# Patient Record
Sex: Male | Born: 1990 | Race: White | Hispanic: No | Marital: Single | Smoking: Never smoker
Health system: Southern US, Community
[De-identification: ages and names within clinical notes are randomized; demographics above are authoritative.]

## PROBLEM LIST (undated history)

## (undated) DIAGNOSIS — L309 Dermatitis, unspecified: Secondary | ICD-10-CM

## (undated) HISTORY — DX: Dermatitis, unspecified: L30.9

---
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
## 2019-02-26 ENCOUNTER — Ambulatory Visit: Payer: BC Managed Care – PPO | Admitting: Allergy & Immunology

## 2019-02-26 ENCOUNTER — Encounter: Payer: Self-pay | Admitting: Allergy & Immunology

## 2019-02-26 ENCOUNTER — Other Ambulatory Visit: Payer: Self-pay

## 2019-02-26 VITALS — BP 116/68 | HR 86 | Temp 98.3°F | Resp 16 | Ht 75.79 in | Wt 199.4 lb

## 2019-02-26 DIAGNOSIS — J3089 Other allergic rhinitis: Secondary | ICD-10-CM

## 2019-02-26 DIAGNOSIS — J302 Other seasonal allergic rhinitis: Secondary | ICD-10-CM

## 2019-02-26 DIAGNOSIS — L232 Allergic contact dermatitis due to cosmetics: Secondary | ICD-10-CM | POA: Diagnosis not present

## 2019-02-26 MED ORDER — MONTELUKAST SODIUM 10 MG PO TABS: 10.0000 mg | ORAL_TABLET | Freq: Every day | ORAL | 2 refills | Status: DC

## 2019-02-26 MED ORDER — FLUTICASONE PROPIONATE 93 MCG/ACT NA EXHU: 2.0000 | INHALANT_SUSPENSION | Freq: Two times a day (BID) | NASAL | 5 refills | Status: DC

## 2019-02-26 MED ORDER — EPINEPHRINE 0.3 MG/0.3ML IJ SOAJ: 0.3000 mg | Freq: Once | INTRAMUSCULAR | 2 refills | Status: AC

## 2019-02-26 NOTE — Progress Notes (Signed)
NEW PATIENT  Date of Service/Encounter:  02/26/19  Referring provider: Patient, No Pcp Per   Assessment:   Seasonal and perennial allergic rhinitis (grasses, ragweed, weeds, trees, dust mites, dog and cockroach)  Allergic contact dermatitis due to cosmetics  Plan/Recommendations:   1. Seasonal and perennial allergic rhinitis - Testing today showed: grasses, ragweed, weeds, trees, dust mites, dog and cockroach - Copy of test results provided.  - Avoidance measures provided. - Stop taking: Zyrtec (cetirizine) - Start taking: Xyzal (levocetirizine) 5mg  tablet once daily, Singulair (montelukast) 10mg  daily and Xhance (fluticasone) 1-2 sprays per nostril twice daily  - We will send in the script to the [facility], and they will call you to confirm your shipping address. - You can review how to use the device here: https://www.xhance.com - Ask to be enrolled in the auto-refill program so you can get a year for free. - You can use an extra dose of the antihistamine, if needed, for breakthrough symptoms.  - Consider nasal saline rinses 1-2 times daily to remove allergens from the nasal cavities as well as help with mucous clearance (this is especially helpful to do before the nasal sprays are given) - Consider allergy shots as a means of long-term control. - Allergy shots "re-train" and "reset" the immune system to ignore environmental allergens and decrease the resulting immune response to those allergens (sneezing, itchy watery eyes, runny nose, nasal congestion, etc).    - Allergy shots improve symptoms in 75-85% of patients.  - We can discuss more at the next appointment if the medications are not working for you.  2. 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 - You need to have a few episodes of exposure before you develop a contact dermatitis, therefore the two  days preceding the emergence of the rash might explain the asymptomatic use before the allergy developed. - I would recommend avoiding that product for now. - If it happens again, we could do patch testing but at this point I think avoiding the triggering lotion would be enough.   3. Return in about 3 months (around 05/29/2019). This can be an in-person, a virtual Webex or a telephone follow up visit.   Subjective:   Brandon Stevens is a 28 y.o. male presenting today for evaluation of  Chief Complaint  Patient presents with  . Allergic Rhinitis     Brandon Stevens has a history of the following: There are no active problems to display for this patient.   History obtained from: chart review and patient.  He is a son-in-law of 1 of our other patients - Brandon Stevens.   Brandon Stevens was referred by Patient, No Pcp Per.     Brandon Stevens is a 28 y.o. male presenting for an evaluation of allergic rhinitis.   Allergic Rhinitis Symptom History: He has had allergies since he was a kid.  He was first allergy tested in 2004 when he was 28 years old.  At that time, he was developing hives when he was playing football in the grass. He had testing that demonstrated multiple positives and he was on shots for five years.  He does think that it helped with the frequency of the hives.  Regardless, he has been fairly stable for a few years.  Over the last 2 to 3 years, his symptoms have been more seasonal and have been  controlled with Zyrtec.  However, over the last year, his symptoms have become more persistent and more severe.  He reports that he has rhinorrhea and itchy watery eyes.  He does not use a nose spray, as he felt that it just came out the front of his nose rather than helping at all.  He thinks he was on Flonase over 4 years ago.    Rash Symptom History: He denies a history of eczema, but he does report that he had a sunburn over the weekend.  He used some type of ointment of his wife's.  He did fine  for a couple days but then on the third day developed confluent erythema and an itchy painful sensation.  This is now resolved after a cold shower.  He does have a history of recurrent sinusitis.  He estimates that he gets treated for sinus infections in the spring and the fall.  He otherwise has no infectious history.  Otherwise, there is no history of other atopic diseases, including asthma, food allergies, drug allergies, stinging insect allergies, urticaria or contact dermatitis. There is no significant infectious history. Vaccinations are up to date.    Past Medical History: There are no active problems to display for this patient.   Medication List:  Allergies as of 02/26/2019   No Known Allergies     Medication List       Accurate as of February 26, 2019  4:47 PM. If you have any questions, ask your nurse or doctor.        cetirizine 10 MG tablet Commonly known as:  ZYRTEC Take by mouth.   EPINEPHrine 0.3 mg/0.3 mL Soaj injection Commonly known as:  Auvi-Q Inject 0.3 mLs (0.3 mg total) into the muscle once for 1 dose. As directed for life-threatening allergic reactions Started by:  Valentina Shaggy, MD   Fluticasone Propionate 93 MCG/ACT Exhu Commonly known as:  Xhance Place 2 sprays into the nose 2 (two) times a day. Started by:  Valentina Shaggy, MD   montelukast 10 MG tablet Commonly known as:  Singulair Take 1 tablet (10 mg total) by mouth at bedtime. Started by:  Valentina Shaggy, MD       Birth History: non-contributory  Developmental History: non-contributory  Past Surgical History: History reviewed. No pertinent surgical history.   Family History: Family History  Problem Relation Age of Onset  . Allergic rhinitis Mother      Social History: Brandon Stevens lives at home with his wife and 2 dogs.  He has grown up with dogs and has never had any symptoms from them.  He was allergic to cats when he was first tested.  Currently he lives in a house  with hardwoods and area rugs throughout the home.  They have electric heating and central cooling.  There are no dust mite covers on the bedding.  There is no tobacco exposure.  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  He did go [place] at [employer].  He went there on a baseball scholarship.   Review of Systems  Constitutional: Negative.  Negative for fever, malaise/fatigue and weight loss.  HENT: Positive for congestion. Negative for ear discharge and ear pain.        Positive for postnasal drip.  Eyes: Negative for pain, discharge and redness.  Respiratory: Negative for cough, sputum production, shortness of breath and wheezing.   Cardiovascular: Negative.  Negative for chest pain and palpitations.  Gastrointestinal: Negative for abdominal pain, heartburn, nausea and vomiting.  Skin: Positive for rash. Negative for itching.  Neurological: Negative for dizziness and headaches.  Endo/Heme/Allergies: Positive for environmental allergies. Does not bruise/bleed easily.       Objective:   Blood pressure 116/68, pulse 86, temperature 98.3 F (36.8 C), resp. rate 16, height 6' 3.79" (1.925 m), weight 199 lb 6.4 oz (90.4 kg), SpO2 95 %. Body mass index is 24.41 kg/m.   Physical Exam:   Physical Exam  Constitutional: He appears well-developed.  Tall male.  HENT:  Head: Normocephalic and atraumatic.  Right Ear: Tympanic membrane, external ear and ear canal normal. No drainage, swelling or tenderness. Tympanic membrane is not injected, not scarred, not erythematous, not retracted and not bulging.  Left Ear: Tympanic membrane, external ear and ear canal normal. No drainage, swelling or tenderness. Tympanic membrane is not injected, not scarred, not erythematous, not retracted and not bulging.  Nose: Mucosal edema and rhinorrhea present. No nasal deformity or septal deviation. No epistaxis. Right sinus exhibits no maxillary sinus tenderness and no  frontal sinus tenderness. Left sinus exhibits no maxillary sinus tenderness and no frontal sinus tenderness.  Mouth/Throat: Uvula is midline and oropharynx is clear and moist. Mucous membranes are not pale and not dry.  Clear rhinorrhea bilaterally.  His turbinates are markedly enlarged.  No polyps appreciated, although I cannot see very far back in his nostrils.  He does have cobblestoning present in the posterior oropharynx.  Eyes: Pupils are equal, round, and reactive to light. Conjunctivae and EOM are normal. Right eye exhibits no chemosis and no discharge. Left eye exhibits no chemosis and no discharge. Right conjunctiva is not injected. Left conjunctiva is not injected.  Cardiovascular: Normal rate, regular rhythm and normal heart sounds.  Respiratory: Effort normal and breath sounds normal. No accessory muscle usage. No tachypnea. No respiratory distress. He has no wheezes. He has no rhonchi. He has no rales. He exhibits no tenderness.  Moving air well in all lung fields.  GI: There is no abdominal tenderness. There is no rebound and no guarding.  Lymphadenopathy:       Head (right side): No submandibular, no tonsillar and no occipital adenopathy present.       Head (left side): No submandibular, no tonsillar and no occipital adenopathy present.    He has no cervical adenopathy.  Neurological: He is alert.  Skin: No abrasion, no petechiae and no rash noted. Rash is not papular, not vesicular and not urticarial. No erythema. No pallor.  Confluent erythema on the chest consistent with a sunburn.  Psychiatric: He has a normal mood and affect.     Diagnostic studies:   Allergy Studies:    Airborne Adult Perc - 02/26/19 1454    Time Antigen Placed  1454    Allergen Manufacturer  Lavella Hammock    Location  Back    Number of Test  59    Panel 1  Select    1. Control-Buffer 50% Glycerol  Negative    2. Control-Histamine 1 mg/ml  2+    3. Albumin saline  Negative    4. [place]  4+    5. [place]   2+    6. [place]  3+    7. [place]  4+    8. Meadow Fescue  3+    9. Perennial Rye  3+    10. Sweet Vernal  2+    11. Timothy  4+    12. Cocklebur  Negative    13. Burweed [place]er  2+    14. Ragweed, short  2+    15. Ragweed, Giant  2+    16. Plantain,  English  2+    17. Lamb's Quarters  Negative    18. Sheep Sorrell  2+    19. Rough Pigweed  2+    20. Marsh Elder, Rough  Negative    21. Mugwort, Common  2+    22. Ash mix  2+    23. Birch mix  3+    24. Beech American  3+    25. Box, Elder  3+    26. Cedar, red  2+    27. Cottonwood, Eastern  3+    28. Elm mix  2+    29. Hickory mix  2+    30. Maple mix  2+    31. Oak, [place] mix  4+    32. Pecan Pollen  2+    33. Pine mix  3+    34. Sycamore Eastern  2+    35. [place], Black Pollen  2+    36. Alternaria alternata  Negative    37. Cladosporium Herbarum  Negative    38. Aspergillus mix  Negative    39. Penicillium mix  Negative    40. Bipolaris sorokiniana (Helminthosporium)  Negative    41. Drechslera spicifera (Curvularia)  Negative    42. Mucor plumbeus  Negative    43. Fusarium moniliforme  Negative    44. Aureobasidium pullulans (pullulara)  Negative    45. Rhizopus oryzae  Negative    46. Botrytis cinera  Negative    47. Epicoccum nigrum  Negative    48. Phoma betae  Negative    49. Candida Albicans  Negative    50. Trichophyton mentagrophytes  Negative    51. Mite, D Farinae  5,000 AU/ml  Negative    52. Mite, D Pteronyssinus  5,000 AU/ml  Negative    53. Cat Hair 10,000 BAU/ml  Negative    54.  Dog Epithelia  Negative    55. Mixed Feathers  Negative    56. Horse Epithelia  Negative    57. Cockroach, German  Negative    58. Mouse  Negative    59. Tobacco Leaf  Negative     Intradermal - 02/26/19 1530    Time Antigen Placed  1530    Allergen Manufacturer  Lavella Hammock    Location  Arm    Number of Test  9    Intradermal  Select    Control  Negative    Mold 1  Negative    Mold 2  Negative     Mold 3  Negative    Mold 4  Negative    Cat  Negative    Dog  1+    Cockroach  2+    Mite mix  2+       Allergy testing results were read and interpreted by myself, documented by clinical staff.         Salvatore Marvel, MD [facility]

## 2019-02-26 NOTE — Patient Instructions (Signed)
1. Seasonal and perennial allergic rhinitis - Testing today showed: grasses, ragweed, weeds, trees, dust mites, dog and cockroach - Copy of test results provided.  - Avoidance measures provided. - Stop taking: Zyrtec (cetirizine) - Start taking: Xyzal (levocetirizine) 5mg  tablet once daily, Singulair (montelukast) 10mg  daily and Xhance (fluticasone) 1-2 sprays per nostril twice daily  - We will send in the script to the [facility], and they will call you to confirm your shipping address. - You can review how to use the device here: https://www.xhance.com - Ask to be enrolled in the auto-refill program so you can get a year for free. - You can use an extra dose of the antihistamine, if needed, for breakthrough symptoms.  - Consider nasal saline rinses 1-2 times daily to remove allergens from the nasal cavities as well as help with mucous clearance (this is especially helpful to do before the nasal sprays are given) - Consider allergy shots as a means of long-term control. - Allergy shots "re-train" and "reset" the immune system to ignore environmental allergens and decrease the resulting immune response to those allergens (sneezing, itchy watery eyes, runny nose, nasal congestion, etc).    - Allergy shots improve symptoms in 75-85% of patients.  - We can discuss more at the next appointment if the medications are not working for you.  2. 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 - You need to have a few episodes of exposure before you develop a contact dermatitis, therefore the two days preceding the emergence of the rash might explain the asymptomatic use before the allergy developed. - I would recommend avoiding that product for now. - If it happens again, we could do patch testing but at this point I think avoiding the triggering lotion would be enough.   3. Return in  about 3 months (around 05/29/2019). This can be an in-person, a virtual Webex or a telephone follow up visit.   Please inform us of any Emergency Department visits, hospitalizations, or changes in symptoms. Call us before going to the ED for breathing or allergy symptoms since we might be able to fit you in for a sick visit. Feel free to contact us anytime with any questions, problems, or concerns.  It was a pleasure to meet you today!  Websites that have reliable patient information: 1. American Academy of Asthma, Allergy, and Immunology: www.aaaai.org 2. Food Allergy Research and Education (FARE): foodallergy.org 3. Mothers of Asthmatics: http://www.asthmacommunitynetwork.org 4. American College of Allergy, Asthma, and Immunology: www.acaai.org  "Like" [place] on Facebook and Instagram for our latest updates!      Make sure you are registered to vote! If you have moved or changed any of your contact information, you will need to get this updated before voting!    Voter ID laws are NOT going into effect for the General Election in November 2020! DO NOT let this stop you from exercising your right to vote!   Absentee voting is the SAFEST way to vote during the coronavirus pandemic! Download and print an absentee ballot request form at [email or web address]  More information on absentee ballots can be found here: [email or web address]   Reducing Pollen Exposure  The American Academy of Allergy, Asthma and Immunology suggests the following steps to reduce your exposure to pollen during allergy seasons.    1. Do not hang sheets or clothing out to dry; pollen may collect on  these items. 2. Do not mow lawns or spend time around freshly cut grass; mowing stirs up pollen. 3. Keep windows closed at night.  Keep car windows closed while driving. 4. Minimize morning activities outdoors, a time when pollen counts are usually  at their highest. 5. Stay indoors as much as possible when pollen counts or humidity is high and on windy days when pollen tends to remain in the air longer. 6. Use air conditioning when possible.  Many air conditioners have filters that trap the pollen spores. 7. Use a HEPA room air filter to remove pollen form the indoor air you breathe.  Control of House Dust Mite Allergen    House dust mites play a major role in allergic asthma and rhinitis.  They occur in environments with high humidity wherever human skin, the food for dust mites is found. High levels have been detected in dust obtained from mattresses, pillows, carpets, upholstered furniture, bed covers, clothes and soft toys.  The principal allergen of the house dust mite is found in its feces.  A gram of dust may contain 1,000 mites and 250,000 fecal particles.  Mite antigen is easily measured in the air during house cleaning activities.    1. Encase mattresses, including the box spring, and pillow, in an air tight cover.  Seal the zipper end of the encased mattresses with wide adhesive tape. 2. Wash the bedding in water of 130 degrees Farenheit weekly.  Avoid cotton comforters/quilts and flannel bedding: the most ideal bed covering is the dacron comforter. 3. Remove all upholstered furniture from the bedroom. 4. Remove carpets, carpet padding, rugs, and non-washable window drapes from the bedroom.  Wash drapes weekly or use plastic window coverings. 5. Remove all non-washable stuffed toys from the bedroom.  Wash stuffed toys weekly. 6. Have the room cleaned frequently with a vacuum cleaner and a damp dust-mop.  The patient should not be in a room which is being cleaned and should wait 1 hour after cleaning before going into the room. 7. Close and seal all heating outlets in the bedroom.  Otherwise, the room will become filled with dust-laden air.  An electric heater can be used to heat the room. 8. Reduce indoor humidity to less than  50%.  Do not use a humidifier.  Control of Dog or Cat Allergen  Avoidance is the best way to manage a dog or cat allergy. If you have a dog or cat and are allergic to dog or cats, consider removing the dog or cat from the home. If you have a dog or cat but don't want to find it a new home, or if your family wants a pet even though someone in the household is allergic, here are some strategies that may help keep symptoms at bay:  1. Keep the pet out of your bedroom and restrict it to only a few rooms. Be advised that keeping the dog or cat in only one room will not limit the allergens to that room. 2. Don't pet, hug or kiss the dog or cat; if you do, wash your hands with soap and water. 3. High-efficiency particulate air (HEPA) cleaners run continuously in a bedroom or living room can reduce allergen levels over time. 4. Regular use of a high-efficiency vacuum cleaner or a central vacuum can reduce allergen levels. 5. Giving your dog or cat a bath at least once a week can reduce airborne allergen.  Control of Cockroach Allergen  Cockroach allergen has been identified as  an important cause of acute attacks of asthma, especially in urban settings.  There are fifty-five species of cockroach that exist in the [place], however only three, the [place], [place] species produce allergen that can affect patients with Asthma.  Allergens can be obtained from fecal particles, egg casings and secretions from cockroaches.    1. Remove food sources. 2. Reduce access to water. 3. Seal access and entry points. 4. Spray runways with 0.5-1% Diazinon or Chlorpyrifos 5. Blow boric acid power under stoves and refrigerator. 6. Place bait stations (hydramethylnon) at feeding sites.  Allergy Shots   Allergies are the result of a chain reaction that starts in the immune system. Your immune system controls how your body defends itself. For instance, if you have an allergy to pollen, your immune  system identifies pollen as an invader or allergen. Your immune system overreacts by producing antibodies called Immunoglobulin E (IgE). These antibodies travel to cells that release chemicals, causing an allergic reaction.  The concept behind allergy immunotherapy, whether it is received in the form of shots or tablets, is that the immune system can be desensitized to specific allergens that trigger allergy symptoms. Although it requires time and patience, the payback can be long-term relief.  How Do Allergy Shots Work?  Allergy shots work much like a vaccine. Your body responds to injected amounts of a particular allergen given in increasing doses, eventually developing a resistance and tolerance to it. Allergy shots can lead to decreased, minimal or no allergy symptoms.  There generally are two phases: build-up and maintenance. Build-up often ranges from three to six months and involves receiving injections with increasing amounts of the allergens. The shots are typically given once or twice a week, though more rapid build-up schedules are sometimes used.  The maintenance phase begins when the most effective dose is reached. This dose is different for each person, depending on how allergic you are and your response to the build-up injections. Once the maintenance dose is reached, there are longer periods between injections, typically two to four weeks.  Occasionally doctors give cortisone-type shots that can temporarily reduce allergy symptoms. These types of shots are different and should not be confused with allergy immunotherapy shots.  Who Can Be Treated with Allergy Shots?  Allergy shots may be a good treatment approach for people with allergic rhinitis (hay fever), allergic asthma, conjunctivitis (eye allergy) or stinging insect allergy.   Before deciding to begin allergy shots, you should consider:  . The length of allergy season and the severity of your symptoms . Whether medications  and/or changes to your environment can control your symptoms . Your desire to avoid long-term medication use . Time: allergy immunotherapy requires a major time commitment . Cost: may vary depending on your insurance coverage  Allergy shots for children age 32 and older are effective and often well tolerated. They might prevent the onset of new allergen sensitivities or the progression to asthma.  Allergy shots are not started on patients who are pregnant but can be continued on patients who become pregnant while receiving them. In some patients with other medical conditions or who take certain common medications, allergy shots may be of risk. It is important to mention other medications you talk to your allergist.   When Will I Feel Better?  Some may experience decreased allergy symptoms during the build-up phase. For others, it may take as long as 12 months on the maintenance dose. If there is no improvement after a  year of maintenance, your allergist will discuss other treatment options with you.  If you aren't responding to allergy shots, it may be because there is not enough dose of the allergen in your vaccine or there are missing allergens that were not identified during your allergy testing. Other reasons could be that there are high levels of the allergen in your environment or major exposure to non-allergic triggers like tobacco smoke.  What Is the Length of Treatment?  Once the maintenance dose is reached, allergy shots are generally continued for three to five years. The decision to stop should be discussed with your allergist at that time. Some people may experience a permanent reduction of allergy symptoms. Others may relapse and a longer course of allergy shots can be considered.  What Are the Possible Reactions?  The two types of adverse reactions that can occur with allergy shots are local and systemic. Common local reactions include very mild redness and swelling at the  injection site, which can happen immediately or several hours after. A systemic reaction, which is less common, affects the entire body or a particular body system. They are usually mild and typically respond quickly to medications. Signs include increased allergy symptoms such as sneezing, a stuffy nose or hives.  Rarely, a serious systemic reaction called anaphylaxis can develop. Symptoms include swelling in the throat, wheezing, a feeling of tightness in the chest, nausea or dizziness. Most serious systemic reactions develop within 30 minutes of allergy shots. This is why it is strongly recommended you wait in your doctor's office for 30 minutes after your injections. Your allergist is trained to watch for reactions, and his or her staff is trained and equipped with the proper medications to identify and treat them.  Who Should Administer Allergy Shots?  The preferred location for receiving shots is your prescribing allergist's office. Injections can sometimes be given at another facility where the physician and staff are trained to recognize and treat reactions, and have received instructions by your prescribing allergist.

## 2019-05-01 ENCOUNTER — Other Ambulatory Visit: Payer: Self-pay | Admitting: Allergy & Immunology

## 2019-05-26 ENCOUNTER — Other Ambulatory Visit: Payer: Self-pay | Admitting: Allergy & Immunology

## 2019-05-28 ENCOUNTER — Encounter: Payer: Self-pay | Admitting: Allergy & Immunology

## 2019-05-28 ENCOUNTER — Ambulatory Visit (INDEPENDENT_AMBULATORY_CARE_PROVIDER_SITE_OTHER): Payer: BC Managed Care – PPO | Admitting: Allergy & Immunology

## 2019-05-28 ENCOUNTER — Other Ambulatory Visit: Payer: Self-pay

## 2019-05-28 DIAGNOSIS — J3089 Other allergic rhinitis: Secondary | ICD-10-CM | POA: Diagnosis not present

## 2019-05-28 DIAGNOSIS — J302 Other seasonal allergic rhinitis: Secondary | ICD-10-CM

## 2019-05-28 DIAGNOSIS — L232 Allergic contact dermatitis due to cosmetics: Secondary | ICD-10-CM

## 2019-05-28 MED ORDER — MONTELUKAST SODIUM 10 MG PO TABS: 10.0000 mg | ORAL_TABLET | Freq: Every day | ORAL | 3 refills | Status: DC

## 2019-05-28 NOTE — Patient Instructions (Addendum)
1. Seasonal and perennial allergic rhinitis (grasses, ragweed, weeds, trees, dust mites, dog and cockroach) - It seems that the current regimen is working well. - Continue taking: Xyzal (levocetirizine) 5mg  tablet once daily, Singulair (montelukast) 10mg  daily and Xhance (fluticasone) 1-2 sprays per nostril twice daily  - We will see how the spring season goes, but otherwise we will see you in one year.   2. 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  - Patch testing is a consideration in the future if needed  3. Return in about 1 year (around 05/27/2020). This can be an in-person, a virtual Webex or a telephone follow up visit.   Please inform us of any Emergency Department visits, hospitalizations, or changes in symptoms. Call us before going to the ED for breathing or allergy symptoms since we might be able to fit you in for a sick visit. Feel free to contact us anytime with any questions, problems, or concerns.  It was a pleasure to talk to you today today!  Websites that have reliable patient information: 1. American Academy of Asthma, Allergy, and Immunology: www.aaaai.org 2. Food Allergy Research and Education (FARE): foodallergy.org 3. Mothers of Asthmatics: http://www.asthmacommunitynetwork.org 4. American College of Allergy, Asthma, and Immunology: www.acaai.org  "Like" [place] on Facebook and Instagram for our latest updates!      Make sure you are registered to vote! If you have moved or changed any of your contact information, you will need to get this updated before voting!  In some cases, you MAY be able to register to vote online: [email or web address]    Voter ID laws are NOT going into effect for the General Election in November 2020! DO NOT let this stop you from exercising your right to vote!   Absentee voting is the SAFEST way to vote during the coronavirus pandemic!   Download and print an  absentee ballot request form at [email or web address] or you can scan the QR code below with your smart phone:      More information on absentee ballots can be found here: [email or web address]

## 2019-05-28 NOTE — Progress Notes (Signed)
RE: Brandon Stevens MRN: [id number] DOB: 12-25-1990 Date of Telemedicine Visit: 05/28/2019  Referring provider: No ref. provider found Primary care provider: Patient, No Pcp Per  Chief Complaint: Allergic Rhinitis    Telemedicine Follow Up Visit via Telephone: I connected with Brandon Stevens for a follow up on 05/29/19 by telephone and verified that I am speaking with the correct person using two identifiers.   I discussed the limitations, risks, security and privacy concerns of performing an evaluation and management service by telephone and the availability of in person appointments. I also discussed with the patient that there may be a patient responsible charge related to this service. The patient expressed understanding and agreed to proceed.  Patient is at work.  Provider is at the office.  Visit start time: 3:33 PM Visit end time: 3:46 PM Insurance consent/check in by: [place] consent and medical assistant/nurse: Garlon Hatchet  History of Present Illness:  He is a 28 y.o. male, who is being followed for perennial and seasonal allergic rhinitis. His previous allergy office visit was in June 2020 with myself.  He was last seen in June 2020.  At that time, he had testing that was positive to grasses, ragweed, weeds, trees, dust mite, dog, and cockroach.  We stopped his Zyrtec and started Xyzal 5 mg daily, Singulair 10 mg daily, and Xhance 1 to 2 sprays per nostril twice daily.  He does have a history of allergic contact dermatitis to cosmetics.  Since the last visit, he has done very well. He has been sleeping well without awakening with allergy symptoms. He does have the nose spray and it is not using it every day. He is using it 2-3 times per week. He was sent 6 bottles of it and he has since cancelled it. He is going to run out of the Singulair. He has been taking the Singulair during the day rather than at night. He is now taking it anytime. He is now having any side effects of this at  all. He will take it when he wakes up from work at noon.   Quality of life is markedly improved. He has been able to mow the yard and has had fewer symptoms. He thinks that he can do fine without allergy shots at this point. He is doing to see how the spring season goes and possibly get back to [place] then. Otherwise he is fine seeing [place] in one year.   Otherwise, there have been no changes to his past medical history, surgical history, family history, or social history.  Assessment and Plan:  [name] is a 28 y.o. 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 Seasonal and perennial allergic rhinitis (grasses, ragweed, weeds, trees, dust mites, dog and cockroach) - It seems that the current regimen is working well. - Continue taking: Xyzal (levocetirizine) 5mg  tablet once daily, Singulair (montelukast) 10mg  daily and Xhance (fluticasone) 1-2 sprays per nostril twice daily  - We will see how the spring season goes, but otherwise we will see you in one year.   2. 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  - Patch testing is a consideration in the future if needed  3. Return in about 1 year (around 05/27/2020). This can be an in-person, a virtual Webex or a telephone follow up visit.    Diagnostics: None.  Medication List:  Current Outpatient Medications  Medication Sig Dispense Refill   cetirizine (ZYRTEC) 10 MG tablet Take by mouth.     montelukast (SINGULAIR) 10 MG tablet Take 1 tablet (10 mg total) by mouth at bedtime. 90 tablet 3   XHANCE 93 MCG/ACT EXHU BLOW 2 DOSES IN EACH NOSTRIL TWICE DAILY 32 mL 1   No current facility-administered medications for this visit.    Allergies: No Known Allergies I reviewed his past medical history, social history, family history, and environmental history and no significant  changes have been reported from previous visits.  Review of Systems  Constitutional: Negative for chills, diaphoresis, fatigue and fever.  HENT: Negative for congestion, ear discharge, ear pain, facial swelling, postnasal drip, rhinorrhea, sinus pressure, sinus pain, sneezing and sore throat.   Eyes: Negative for pain, discharge and itching.  Respiratory: Negative for apnea, cough, chest tightness and shortness of breath.   Cardiovascular: Negative for chest pain.  Gastrointestinal: Negative for diarrhea and nausea.  Endocrine: Negative for cold intolerance and heat intolerance.  Musculoskeletal: Negative for arthralgias and myalgias.  Skin: Negative for rash.  Allergic/Immunologic: Positive for environmental allergies. Negative for food allergies and immunocompromised state.    Objective:  Physical exam not obtained as encounter was done via telephone.   Previous notes and tests were reviewed.  I discussed the assessment and treatment plan with the patient. The patient was provided an opportunity to ask questions and all were answered. The patient agreed with the plan and demonstrated an understanding of the instructions.   The patient was advised to call back or seek an in-person evaluation if the symptoms worsen or if the condition fails to improve as anticipated.  I provided 13 minutes of non-face-to-face time during this encounter.  It was my pleasure to participate in [place] care today. Please feel free to contact me with any questions or concerns.   Sincerely,  Valentina Shaggy, MD

## 2019-05-29 ENCOUNTER — Encounter: Payer: Self-pay | Admitting: Allergy & Immunology

## 2019-10-03 ENCOUNTER — Other Ambulatory Visit: Payer: Self-pay

## 2019-10-03 ENCOUNTER — Ambulatory Visit: Payer: BC Managed Care – PPO | Attending: Internal Medicine

## 2019-10-03 DIAGNOSIS — Z20822 Contact with and (suspected) exposure to covid-19: Secondary | ICD-10-CM

## 2019-10-04 LAB — NOVEL CORONAVIRUS, NAA: SARS-CoV-2, NAA: NOT DETECTED

## 2019-11-06 ENCOUNTER — Ambulatory Visit: Payer: BC Managed Care – PPO | Attending: Internal Medicine

## 2019-11-06 ENCOUNTER — Other Ambulatory Visit: Payer: Self-pay

## 2019-11-06 DIAGNOSIS — Z20822 Contact with and (suspected) exposure to covid-19: Secondary | ICD-10-CM

## 2019-11-07 LAB — NOVEL CORONAVIRUS, NAA: SARS-CoV-2, NAA: NOT DETECTED

## 2020-04-19 ENCOUNTER — Encounter: Payer: Self-pay | Admitting: Physician Assistant

## 2020-04-19 ENCOUNTER — Other Ambulatory Visit: Payer: Self-pay

## 2020-04-19 ENCOUNTER — Ambulatory Visit: Payer: BC Managed Care – PPO | Admitting: Physician Assistant

## 2020-04-19 DIAGNOSIS — D224 Melanocytic nevi of scalp and neck: Secondary | ICD-10-CM

## 2020-04-19 DIAGNOSIS — D485 Neoplasm of uncertain behavior of skin: Secondary | ICD-10-CM

## 2020-04-19 NOTE — Progress Notes (Signed)
° °  Follow up Visit  Subjective  Brandon Stevens is a 29 y.o. male who presents for the following: Skin Problem (moles on body, spot on face). Skin tag on left cheek that has been there a few months and has grown in the past 3 weeks. Mole on the back of the neck present for years that have recently been getting irritated.   Objective  Well appearing patient in no apparent distress; mood and affect are within normal limits.  A focused examination was performed including face, neck, chest and back. Relevant physical exam findings are noted in the Assessment and Plan.   Objective  Posterior Mid Neck: Inflamed papule     Objective  Left Occipital Scalp: Inflamed papule     Objective  Left Malar Cheek: Pink crusted lesion     Assessment & Plan  Neoplasm of uncertain behavior of skin (3) Posterior Mid Neck  Skin / nail biopsy Type of biopsy: tangential   Informed consent: discussed and consent obtained   Timeout: patient name, date of birth, surgical site, and procedure verified   Procedure prep:  Patient was prepped and draped in usual sterile fashion (Non sterile) Prep type:  Chlorhexidine Anesthesia: the lesion was anesthetized in a standard fashion   Anesthetic:  1% lidocaine w/ epinephrine 1-100,000 local infiltration Instrument used: flexible razor blade   Outcome: patient tolerated procedure well   Post-procedure details: wound care instructions given    Specimen 1 - Surgical pathology Differential Diagnosis:  Check Margins: No  Left Occipital Scalp  Skin / nail biopsy Type of biopsy: tangential   Informed consent: discussed and consent obtained   Timeout: patient name, date of birth, surgical site, and procedure verified   Procedure prep:  Patient was prepped and draped in usual sterile fashion (Non sterile) Prep type:  Chlorhexidine Anesthesia: the lesion was anesthetized in a standard fashion   Anesthetic:  1% lidocaine w/ epinephrine 1-100,000 local  infiltration Instrument used: flexible razor blade   Outcome: patient tolerated procedure well   Post-procedure details: wound care instructions given    Specimen 2 - Surgical pathology Differential Diagnosis:  Check Margins: No  Left Malar Cheek  Skin / nail biopsy Type of biopsy: tangential   Informed consent: discussed and consent obtained   Timeout: patient name, date of birth, surgical site, and procedure verified   Procedure prep:  Patient was prepped and draped in usual sterile fashion (Non sterile) Prep type:  Chlorhexidine Anesthesia: the lesion was anesthetized in a standard fashion   Anesthetic:  1% lidocaine w/ epinephrine 1-100,000 local infiltration Instrument used: flexible razor blade   Outcome: patient tolerated procedure well   Post-procedure details: wound care instructions given    Specimen 3 - Surgical pathology Differential Diagnosis:  Check Margins: No

## 2020-04-19 NOTE — Patient Instructions (Signed)

## 2020-06-08 ENCOUNTER — Telehealth: Payer: Self-pay | Admitting: Allergy & Immunology

## 2020-06-08 ENCOUNTER — Other Ambulatory Visit: Payer: Self-pay

## 2020-06-08 MED ORDER — MONTELUKAST SODIUM 10 MG PO TABS: 10.0000 mg | ORAL_TABLET | Freq: Every day | ORAL | 0 refills | Status: DC

## 2020-06-08 NOTE — Telephone Encounter (Signed)
Patient called to get a refill on his Singulair. Patient is now scheduled to see Chrissie on 06/16/20. Can we send in a courtesy refill?

## 2020-06-08 NOTE — Telephone Encounter (Signed)
Courtesy refill sent and patient has been notified.

## 2020-06-15 NOTE — Patient Instructions (Addendum)
Seasonal and perennial allergic rhinitis ( June 2020- grass, ragweed, weeds, tree, dust mite, dog and cockroach) Continue Xyzal 5 mg once a day as needed for runny nose or itching Continue Singulair 5 mg once a day  Continue Xhance 1-2 sprays each nostril twice a day as needed for stuffy nose  Allergic contact dermatitis to cosmetics Continue to avoid triggering topicals Consider patch testing in the future if needed  Please let us know if this treatment plan is not working well for you Schedule follow up appointment in 1 year

## 2020-06-16 ENCOUNTER — Ambulatory Visit: Payer: BC Managed Care – PPO | Admitting: Family

## 2020-06-16 ENCOUNTER — Encounter: Payer: Self-pay | Admitting: Family

## 2020-06-16 ENCOUNTER — Other Ambulatory Visit: Payer: Self-pay

## 2020-06-16 VITALS — BP 110/60 | HR 75 | Temp 98.0°F | Resp 18 | Ht 76.0 in | Wt 203.0 lb

## 2020-06-16 DIAGNOSIS — J3089 Other allergic rhinitis: Secondary | ICD-10-CM

## 2020-06-16 DIAGNOSIS — J302 Other seasonal allergic rhinitis: Secondary | ICD-10-CM | POA: Diagnosis not present

## 2020-06-16 DIAGNOSIS — L232 Allergic contact dermatitis due to cosmetics: Secondary | ICD-10-CM

## 2020-06-16 MED ORDER — MONTELUKAST SODIUM 10 MG PO TABS: 10.0000 mg | ORAL_TABLET | Freq: Every day | ORAL | 11 refills | Status: DC

## 2020-06-16 NOTE — Progress Notes (Signed)
[address], [address] [place] [address] Dept: [phone]  FOLLOW UP NOTE  Patient ID: Brandon Stevens, male    DOB: January 17, 1991  Age: 29 y.o. MRN: [id number] Date of Office Visit: 06/16/2020  Assessment  Chief Complaint: Follow-up and Allergies  HPI Brandon Stevens is a 29 year old male who presents today for follow-up of seasonal and perennial allergic rhinitis and allergic contact dermatitis due to cosmetics. He was last seen on May 28, 2019 by Dr. Ernst Bowler.  Seasonal and perennial allergic rhinitis is reported as controlled with Xyzal 5 mg once a day, Singulair 10 mg once a day and XHANCE 1 to 2 sprays each nostril twice a day as needed. He denies rhinorrhea, nasal congestion, postnasal drip, and sneezing. He reports that approximately 2 to 3 weeks ago after mowing that his right eye became bloodshot and really itchy for the next 2 days. He denied any pain or vision changes. Other than that he has not had any itchy watery eyes.  He continues to avoid cosmetic products and has not had any problems with contact dermatitis since.  Current medications are as listed in the chart.  Drug Allergies:  No Known Allergies  Review of Systems: Review of Systems  Constitutional: Negative for chills and fever.  HENT:       Denies rhinorrhea, post nasal drip and nasal congestion  Eyes:       Denies itchy watery eyes  Respiratory: Negative for cough, shortness of breath and wheezing.   Cardiovascular: Negative for chest pain and palpitations.  Gastrointestinal: Negative for abdominal pain and heartburn.  Genitourinary: Negative for dysuria.  Skin: Negative for itching and rash.  Neurological: Negative for headaches.  Endo/Heme/Allergies: Positive for environmental allergies.    Physical Exam: BP 110/60   Pulse 75   Temp 98 F (36.7 C) (Temporal)   Resp 18   Ht 6\' 4"  (1.93 m)   Wt 203 lb (92.1 kg)   SpO2 99%   BMI 24.71 kg/m    Physical Exam Constitutional:       Appearance: Normal appearance.  HENT:     Head: Normocephalic and atraumatic.     Comments: Pharynx normal. Ears normal. Eyes normal. Nose normal    Right Ear: Tympanic membrane, ear canal and external ear normal.     Left Ear: Tympanic membrane, ear canal and external ear normal.     Nose: Nose normal.     Mouth/Throat:     Mouth: Mucous membranes are moist.     Pharynx: Oropharynx is clear.  Eyes:     Conjunctiva/sclera: Conjunctivae normal.  Cardiovascular:     Rate and Rhythm: Normal rate and regular rhythm.     Heart sounds: Normal heart sounds.  Pulmonary:     Effort: Pulmonary effort is normal.     Breath sounds: Normal breath sounds.     Comments: Lungs clear to auscultation Musculoskeletal:     Cervical back: Neck supple.  Skin:    General: Skin is warm.  Neurological:     Mental Status: He is alert and oriented to person, place, and time.  Psychiatric:        Mood and Affect: Mood normal.        Behavior: Behavior normal.        Thought Content: Thought content normal.        Judgment: Judgment normal.     Diagnostics:  None  Assessment and Plan: 1. Seasonal and perennial allergic rhinitis   2.  Allergic contact dermatitis due to cosmetics     No orders of the defined types were placed in this encounter.   Patient Instructions  Seasonal and perennial allergic rhinitis ( June 2020- grass, ragweed, weeds, tree, dust mite, dog and cockroach) Continue Xyzal 5 mg once a day as needed for runny nose or itching Continue Singulair 5 mg once a day  Continue Xhance 1-2 sprays each nostril twice a day as needed for stuffy nose  Allergic contact dermatitis to cosmetics Continue to avoid triggering topicals Consider patch testing in the future if needed  Please let us know if this treatment plan is not working well for you Schedule follow up appointment in 1 year   Return in about 1 year (around 06/16/2021), or if symptoms worsen or fail to improve.    Thank  you for the opportunity to care for this patient.  Please do not hesitate to contact me with questions.  Althea Charon, FNP [facility]

## 2021-07-19 NOTE — Patient Instructions (Addendum)
Seasonal and perennial allergic rhinitis ( June 2020- grass, ragweed, weeds, tree, dust mite, dog and cockroach) Continue Xyzal 5 mg once a day to twice a day as needed for runny nose or itching Continue Singulair 10 mg once a day  Continue Xhance 1-2 sprays each nostril twice a day as needed for stuffy nose  Allergic contact dermatitis to cosmetics Continue to avoid triggering topicals Consider patch testing in the future if needed  Please let us know if this treatment plan is not working well for you Schedule follow up appointment in 1 year

## 2021-07-20 ENCOUNTER — Ambulatory Visit: Payer: BC Managed Care – PPO | Admitting: Family

## 2021-07-20 ENCOUNTER — Encounter: Payer: Self-pay | Admitting: Family

## 2021-07-20 ENCOUNTER — Other Ambulatory Visit: Payer: Self-pay

## 2021-07-20 VITALS — BP 110/66 | HR 82 | Temp 98.3°F | Resp 16 | Ht 76.0 in | Wt 201.0 lb

## 2021-07-20 DIAGNOSIS — J3089 Other allergic rhinitis: Secondary | ICD-10-CM

## 2021-07-20 DIAGNOSIS — L232 Allergic contact dermatitis due to cosmetics: Secondary | ICD-10-CM

## 2021-07-20 DIAGNOSIS — J302 Other seasonal allergic rhinitis: Secondary | ICD-10-CM

## 2021-07-20 MED ORDER — EPINEPHRINE 0.3 MG/0.3ML IJ SOAJ: 0.3000 mg | INTRAMUSCULAR | 1 refills | Status: AC | PRN

## 2021-07-20 MED ORDER — LEVOCETIRIZINE DIHYDROCHLORIDE 5 MG PO TABS: 5.0000 mg | ORAL_TABLET | Freq: Every evening | ORAL | 2 refills | Status: DC

## 2021-07-20 MED ORDER — MONTELUKAST SODIUM 10 MG PO TABS: 10.0000 mg | ORAL_TABLET | Freq: Every day | ORAL | 2 refills | Status: DC

## 2021-07-20 NOTE — Progress Notes (Signed)
[address], [address] [place]  [id number] Dept: [phone]  FOLLOW UP NOTE  Patient ID: Brandon Stevens, male    DOB: [birth year]  Age: 30 y.o. 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  He was last seen on June 16, 2020 by Althea Charon, FNP.  Seasonal and perennial allergic rhinitis is reported as moderately controlled with Xyzal 5 mg once a day to twice a day, Singulair 5 mg once a day, and Xhance nasal spray as needed.  He reports that during pollen season when he is outside mowing he will have itchy throat, itchy eyes, rhinorrhea, nasal congestion, and postnasal drip.  He has completed 5 years of immunotherapy while in college.  He reports that when he mows he wears a mask, goggles, long sleeves and pants.  He will also break out into hives after being outside mowing.  He will get in the shower after mowing and the hives will fade away after an hour.  Sometimes he does take Xyzal twice a day if needed.  He is not interested in trying allergy injections again at this time.  He does have over-the-counter eyedrops that he uses for his itchy eyes.  He mentions that his Auvi-Q is approximately 31 years old and was given this because he felt like his throat was closing up after being outside and mowing.  He continues to avoid triggering topicals.  He has not had any contact dermatitis since we last saw him.  He reports that he thinks that the original reaction occurred when his wife gave him a lotion to put on his chest for possible sunburn and it started burning like crazy.He does not know what lotion he used.   Drug Allergies:  No Known Allergies  Review of  Systems: Review of Systems  Constitutional:  Negative for chills and fever.  HENT:         Reports itchy throat rhinorrhea, nasal congestion, postnasal drip during  pollen season  Eyes:        Reports itchy eyes during pollen season.  He has over-the-counter eyedrops that help  Respiratory:  Negative for cough, shortness of breath and wheezing.   Cardiovascular:  Negative for chest pain and palpitations.  Gastrointestinal:        Denies heartburn or reflux  Genitourinary:  Negative for frequency.  Skin:  Positive for itching and rash.       Reports rash and itchy skin after mowing  Neurological:  Negative for headaches.  Endo/Heme/Allergies:  Positive for environmental allergies.    Physical Exam: BP 110/66   Pulse 82   Temp 98.3 F (36.8 C) (Temporal)   Resp 16   Ht 6\' 4"  (1.93 m)   Wt 201 lb (91.2 kg)   SpO2 98%   BMI 24.47 kg/m    Physical Exam Constitutional:      Appearance: Normal appearance.  HENT:     Head: Normocephalic and atraumatic.     Comments: Pharynx normal, eyes normal, ears normal, nose: Bilateral lower turbinates mildly edematous and slightly erythematous with no drainage noted    Right Ear: Tympanic membrane, ear canal and external ear normal.     Left Ear: Tympanic  membrane, ear canal and external ear normal.     Mouth/Throat:     Mouth: Mucous membranes are moist.     Pharynx: Oropharynx is clear.  Eyes:     Conjunctiva/sclera: Conjunctivae normal.  Cardiovascular:     Rate and Rhythm: Regular rhythm.     Heart sounds: Normal heart sounds.  Pulmonary:     Effort: Pulmonary effort is normal.     Breath sounds: Normal breath sounds.     Comments: Lungs clear to auscultation Musculoskeletal:     Cervical back: Neck supple.  Skin:    General: Skin is warm.  Neurological:     Mental Status: He is alert and oriented to person, place, and time.  Psychiatric:        Mood and Affect: Mood normal.        Behavior: Behavior normal.        Thought  Content: Thought content normal.        Judgment: Judgment normal.    Diagnostics: None  Assessment and Plan: 1. Seasonal and perennial allergic rhinitis   2. Allergic contact dermatitis due to cosmetics     Meds ordered this encounter  Medications   levocetirizine (XYZAL) 5 MG tablet    Sig: Take 1 tablet (5 mg total) by mouth every evening.    Dispense:  90 tablet    Refill:  2   montelukast (SINGULAIR) 10 MG tablet    Sig: Take 1 tablet (10 mg total) by mouth at bedtime.    Dispense:  90 tablet    Refill:  2   EPINEPHrine (AUVI-Q) 0.3 mg/0.3 mL IJ SOAJ injection    Sig: Inject 0.3 mg into the muscle as needed for anaphylaxis.    Dispense:  1 each    Refill:  1    Patient Instructions  Seasonal and perennial allergic rhinitis ( June 2020- grass, ragweed, weeds, tree, dust mite, dog and cockroach) Continue Xyzal 5 mg once a day to twice a day as needed for runny nose or itching Continue Singulair 10 mg once a day  Continue Xhance 1-2 sprays each nostril twice a day as needed for stuffy nose  Allergic contact dermatitis to cosmetics Continue to avoid triggering topicals Consider patch testing in the future if needed  Please let us know if this treatment plan is not working well for you Schedule follow up appointment in 1 year Return in about 1 year (around 07/20/2022), or if symptoms worsen or fail to improve.    Thank you for the opportunity to care for this patient.  Please do not hesitate to contact me with questions.  Althea Charon, FNP [facility]

## 2022-06-15 ENCOUNTER — Other Ambulatory Visit: Payer: Self-pay | Admitting: Family

## 2022-06-15 ENCOUNTER — Telehealth: Payer: Self-pay | Admitting: Family

## 2022-06-15 MED ORDER — MONTELUKAST SODIUM 10 MG PO TABS: 10.0000 mg | ORAL_TABLET | Freq: Every day | ORAL | 0 refills | Status: DC

## 2022-06-15 NOTE — Telephone Encounter (Signed)
Pt states pharmacy won't refill montelukast, requesting a refill

## 2022-06-15 NOTE — Telephone Encounter (Signed)
Sent in a 1 month supply as pt is due for office visit in oct 2023

## 2022-10-05 ENCOUNTER — Other Ambulatory Visit: Payer: Self-pay | Admitting: Family

## 2022-10-25 ENCOUNTER — Ambulatory Visit: Payer: BC Managed Care – PPO | Admitting: Allergy & Immunology

## 2022-10-25 VITALS — BP 124/82 | HR 100 | Temp 98.5°F | Resp 18 | Ht 76.0 in | Wt 214.6 lb

## 2022-10-25 DIAGNOSIS — J0100 Acute maxillary sinusitis, unspecified: Secondary | ICD-10-CM

## 2022-10-25 DIAGNOSIS — J302 Other seasonal allergic rhinitis: Secondary | ICD-10-CM

## 2022-10-25 DIAGNOSIS — J3089 Other allergic rhinitis: Secondary | ICD-10-CM | POA: Diagnosis not present

## 2022-10-25 MED ORDER — MONTELUKAST SODIUM 10 MG PO TABS: 10.0000 mg | ORAL_TABLET | Freq: Every day | ORAL | 3 refills | Status: DC

## 2022-10-25 MED ORDER — LEVOCETIRIZINE DIHYDROCHLORIDE 5 MG PO TABS: 5.0000 mg | ORAL_TABLET | Freq: Every evening | ORAL | 3 refills | Status: DC

## 2022-10-25 NOTE — Patient Instructions (Addendum)
1. Seasonal and perennial allergic rhinitis (grasses, ragweed, weeds, trees, dust mites, dog and cockroach) - It seems that the current regimen is working well. - This regimen seems to be doing the trick.  - We sent in 90 day supplies to your pharmacy.  - Continue taking: Xyzal (levocetirizine) '5mg'$  tablet once daily, Singulair (montelukast) '10mg'$  daily and Xhance (fluticasone) 1-2 sprays per nostril twice daily  - Call us if you need an antibiotic later this week for your sinus infection.   2. Follow up in one year or earlier if needed.    Please inform us of any Emergency Department visits, hospitalizations, or changes in symptoms. Call us before going to the ED for breathing or allergy symptoms since we might be able to fit you in for a sick visit. Feel free to contact us anytime with any questions, problems, or concerns.  It was a pleasure to see you today!  Websites that have reliable patient information: 1. American Academy of Asthma, Allergy, and Immunology: www.aaaai.org 2. Food Allergy Research and Education (FARE): foodallergy.org 3. Mothers of Asthmatics: http://www.asthmacommunitynetwork.org 4. American College of Allergy, Asthma, and Immunology: www.acaai.org  "Like" [place] on Facebook and Instagram for our latest updates!      Make sure you are registered to vote! If you have moved or changed any of your contact information, you will need to get this updated before voting!  In some cases, you MAY be able to register to vote online: [email or web address]

## 2022-10-25 NOTE — Progress Notes (Unsigned)
FOLLOW UP  Date of Service/Encounter:  10/25/22   Assessment:   Seasonal and perennial allergic rhinitis (grasses, ragweed, weeds, trees, dust mites, dog and cockroach)   Allergic contact dermatitis due to cosmetics  Plan/Recommendations:   1. Seasonal and perennial allergic rhinitis (grasses, ragweed, weeds, trees, dust mites, dog and cockroach) - It seems that the current regimen is working well. - This regimen seems to be doing the trick.  - We sent in 90 day supplies to your pharmacy.  - Continue taking: Xyzal (levocetirizine) '5mg'$  tablet once daily, Singulair (montelukast) '10mg'$  daily and Xhance (fluticasone) 1-2 sprays per nostril twice daily  - Call us if you need an antibiotic later this week for your sinus infection.   2. Follow up in one year or earlier if needed.    Subjective:   Brandon Stevens is a 32 y.o. male presenting today for follow up of  Chief Complaint  Patient presents with   Sinus Problem    Brandon Stevens has a history of the following: There are no problems to display for this patient.   History obtained from: chart review and patient.  Brandon Stevens is a 32 y.o. male presenting for a follow up visit.  He was last seen by Althea Charon in October 2022.  At that time, we continue with Xyzal as well as Singulair and Xhance.  Since last visit, he has done well.    He started having sinus infection that started three days ago. He has been using some Advil cold and sinus that is mostly doing the trick. It is mainly congestion. He did have some facial pain the first night but this seems to be getting better.   He did have a sinus infection and saw Dr. Nevada Crane in the fall. He ended up having COVID19. He gets them twice annually. He typically just uses the Advil Cold and Sinus.   He also has the [place] that he uses as needed.  He has a large supply of it because they kept sending it to him after he first prescribed it.  So he has several boxes at home.  Overall,  he is doing well without needing it every single day.  He has issues when he is mowing and it is windy and dusty. This is particularly bad for him.   They have two kids now - the oldest is sleeping through the night thankfully.   Otherwise, there have been no changes to his past medical history, surgical history, family history, or social history.    Review of Systems  Constitutional: Negative.  Negative for fever, malaise/fatigue and weight loss.  HENT:  Positive for congestion. Negative for ear discharge and ear pain.        Positive for postnasal drip.  Eyes:  Negative for pain, discharge and redness.  Respiratory:  Negative for cough, sputum production, shortness of breath and wheezing.   Cardiovascular: Negative.  Negative for chest pain and palpitations.  Gastrointestinal:  Negative for abdominal pain, heartburn, nausea and vomiting.  Skin:  Negative for itching and rash.  Neurological:  Negative for dizziness and headaches.  Endo/Heme/Allergies:  Positive for environmental allergies. Does not bruise/bleed easily.       Objective:   Blood pressure 124/82, pulse 100, temperature 98.5 F (36.9 C), resp. rate 18, height '6\' 4"'$  (1.93 m), weight 214 lb 9.6 oz (97.3 kg), SpO2 95 %. Body mass index is 26.12 kg/m.    Physical Exam Vitals reviewed.  Constitutional:  Appearance: He is well-developed.     Comments: Tall male.  HENT:     Head: Normocephalic and atraumatic.     Right Ear: Tympanic membrane, ear canal and external ear normal. No drainage, swelling or tenderness. Tympanic membrane is not injected, scarred, erythematous, retracted or bulging.     Left Ear: Tympanic membrane, ear canal and external ear normal. No drainage, swelling or tenderness. Tympanic membrane is not injected, scarred, erythematous, retracted or bulging.     Nose: Mucosal edema and rhinorrhea present. No nasal deformity or septal deviation.     Right Sinus: No maxillary sinus tenderness or  frontal sinus tenderness.     Left Sinus: No maxillary sinus tenderness or frontal sinus tenderness.     Mouth/Throat:     Mouth: Mucous membranes are not pale and not dry.     Pharynx: Uvula midline.  Eyes:     General:        Right eye: No discharge.        Left eye: No discharge.     Conjunctiva/sclera: Conjunctivae normal.     Right eye: Right conjunctiva is not injected. No chemosis.    Left eye: Left conjunctiva is not injected. No chemosis.    Pupils: Pupils are equal, round, and reactive to light.  Cardiovascular:     Rate and Rhythm: Normal rate and regular rhythm.     Heart sounds: Normal heart sounds.  Pulmonary:     Effort: Pulmonary effort is normal. No tachypnea, accessory muscle usage or respiratory distress.     Breath sounds: Normal breath sounds. No wheezing, rhonchi or rales.  Chest:     Chest wall: No tenderness.  Abdominal:     Tenderness: There is no abdominal tenderness. There is no guarding or rebound.  Lymphadenopathy:     Head:     Right side of head: No submandibular, tonsillar or occipital adenopathy.     Left side of head: No submandibular, tonsillar or occipital adenopathy.     Cervical: No cervical adenopathy.  Skin:    General: Skin is warm.     Capillary Refill: Capillary refill takes less than 2 seconds.     Coloration: Skin is not pale.     Findings: No abrasion, erythema, petechiae or rash. Rash is not papular, urticarial or vesicular.  Neurological:     Mental Status: He is alert.      Diagnostic studies: none   Salvatore Marvel, MD  [facility]

## 2022-10-26 ENCOUNTER — Encounter: Payer: Self-pay | Admitting: Allergy & Immunology

## 2023-08-09 ENCOUNTER — Other Ambulatory Visit: Payer: Self-pay | Admitting: Family

## 2023-10-26 ENCOUNTER — Ambulatory Visit: Payer: 59 | Admitting: Allergy & Immunology

## 2023-10-26 ENCOUNTER — Encounter: Payer: Self-pay | Admitting: Allergy & Immunology

## 2023-10-26 VITALS — BP 118/76 | HR 91 | Temp 98.0°F | Resp 18 | Ht 76.0 in | Wt 208.0 lb

## 2023-10-26 DIAGNOSIS — B999 Unspecified infectious disease: Secondary | ICD-10-CM | POA: Diagnosis not present

## 2023-10-26 DIAGNOSIS — J3089 Other allergic rhinitis: Secondary | ICD-10-CM | POA: Diagnosis not present

## 2023-10-26 DIAGNOSIS — J302 Other seasonal allergic rhinitis: Secondary | ICD-10-CM

## 2023-10-26 MED ORDER — MONTELUKAST SODIUM 10 MG PO TABS: ORAL_TABLET | ORAL | 3 refills | Status: AC

## 2023-10-26 MED ORDER — LEVOCETIRIZINE DIHYDROCHLORIDE 5 MG PO TABS: 5.0000 mg | ORAL_TABLET | Freq: Every evening | ORAL | 3 refills | Status: AC

## 2023-10-26 NOTE — Progress Notes (Unsigned)
FOLLOW UP  Date of Service/Encounter:  10/26/23   Assessment:   Seasonal and perennial allergic rhinitis (grasses, ragweed, weeds, trees, dust mites, dog and cockroach)   Allergic contact dermatitis due to cosmetics  Recurrent infections - improved with better control of his atopic disease  Plan/Recommendations:   1. Seasonal and perennial allergic rhinitis (grasses, ragweed, weeds, trees, dust mites, dog and cockroach) - It seems that the current regimen is working well. - I am glad that things are going well.   - We sent in 90 day supplies to your pharmacy.  - Continue taking: Xyzal (levocetirizine) 5mg  tablet once daily, Singulair (montelukast) 10mg  daily and Xhance (fluticasone) 1-2 sprays per nostril twice daily  - Call us if you need an antibiotic later this week for your sinus infection.   2. Return in about 1 year (around 10/25/2024). You can have the follow up appointment with Dr. Dellis Anes or a Nurse Practicioner (our Nurse Practitioners are excellent and always have Physician oversight!).     Subjective:   Brandon Stevens is a 33 y.o. male presenting today for follow up of  Chief Complaint  Patient presents with   Follow-up    Brandon Stevens has a history of the following: There are no active problems to display for this patient.   History obtained from: chart review and patient.  Discussed the use of AI scribe software for clinical note transcription with the patient and/or guardian, who gave verbal consent to proceed.  Ira is a 33 y.o. male presenting for a follow up visit.  He was last seen in January 2024.  At that time, he was doing well with Xyzal 5 mg daily as well as Singulair and Xhance.  Since last visit, he has done well.  Allergic Rhinitis Symptom History: He uses Xhance nasal spray only during severe reactions to cold weather. He also takes two tablets of an unspecified medication regularly and is managing well with this regimen. He wears a  mask in classes to prevent reactions. He has a history of frequent infections, typically requiring antibiotics two to three times a year, although he has not needed antibiotics since last spring.  He has two white areas in his ear canal, which were noted by his wife to be possibly increasing in size. He has previously consulted with Ear, Nose, and Throat specialists regarding these lesions.  All I can see today is some flaking skin and wax in the ear canals.  Skin Symptom History: He mentions a history of eczema and a past dermatology consultation in 2021, where a biopsy was performed, and the results were benign. He also had a skin tag in an unspecified location, which was described as 'annoying'.   His 2 kids are doing great.  He is doing a solo parenting thing this weekend since his wife is out of town at a church function.  Work is going well.  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  Otherwise, there have been no changes to his past medical history, surgical history, family history, or social history.    Review of systems otherwise negative other than that mentioned in the HPI.    Objective:   Blood pressure 118/76, pulse 91, temperature 98 F (36.7 C), resp. rate 18, height 6\' 4"  (1.93 m), weight 208 lb (94.3 kg), SpO2 97%. Body mass index is  25.32 kg/m.    Physical Exam Vitals reviewed.  Constitutional:      Appearance: He is well-developed.     Comments: Tall male.  Smiling and pleasant.  HENT:     Head: Normocephalic and atraumatic.     Right Ear: Tympanic membrane, ear canal and external ear normal. No drainage, swelling or tenderness. Tympanic membrane is not injected, scarred, erythematous, retracted or bulging.     Left Ear: Tympanic membrane, ear canal and external ear normal. No drainage, swelling or tenderness. Tympanic membrane is not injected, scarred,  erythematous, retracted or bulging.     Nose: Mucosal edema and rhinorrhea present. No nasal deformity or septal deviation.     Right Turbinates: Enlarged, swollen and pale.     Left Turbinates: Enlarged, swollen and pale.     Right Sinus: No maxillary sinus tenderness or frontal sinus tenderness.     Left Sinus: No maxillary sinus tenderness or frontal sinus tenderness.     Mouth/Throat:     Mouth: Mucous membranes are not pale and not dry.     Pharynx: Uvula midline.  Eyes:     General:        Right eye: No discharge.        Left eye: No discharge.     Conjunctiva/sclera: Conjunctivae normal.     Right eye: Right conjunctiva is not injected. No chemosis.    Left eye: Left conjunctiva is not injected. No chemosis.    Pupils: Pupils are equal, round, and reactive to light.  Cardiovascular:     Rate and Rhythm: Normal rate and regular rhythm.     Heart sounds: Normal heart sounds.  Pulmonary:     Effort: Pulmonary effort is normal. No tachypnea, accessory muscle usage or respiratory distress.     Breath sounds: Normal breath sounds. No wheezing, rhonchi or rales.  Chest:     Chest wall: No tenderness.  Abdominal:     Tenderness: There is no abdominal tenderness. There is no guarding or rebound.  Lymphadenopathy:     Head:     Right side of head: No submandibular, tonsillar or occipital adenopathy.     Left side of head: No submandibular, tonsillar or occipital adenopathy.     Cervical: No cervical adenopathy.  Skin:    General: Skin is warm.     Capillary Refill: Capillary refill takes less than 2 seconds.     Coloration: Skin is not pale.     Findings: No abrasion, erythema, petechiae or rash. Rash is not papular, urticarial or vesicular.  Neurological:     Mental Status: He is alert.  Psychiatric:        Behavior: Behavior is cooperative.      Diagnostic studies: none       Malachi Bonds, MD  [facility]

## 2023-10-26 NOTE — Patient Instructions (Addendum)
1. Seasonal and perennial allergic rhinitis (grasses, ragweed, weeds, trees, dust mites, dog and cockroach) - It seems that the current regimen is working well. - I am glad that things are going well.   - We sent in 90 day supplies to your pharmacy.  - Continue taking: Xyzal (levocetirizine) 5mg  tablet once daily, Singulair (montelukast) 10mg  daily and Xhance (fluticasone) 1-2 sprays per nostril twice daily  - Call us if you need an antibiotic later this week for your sinus infection.   2. Return in about 1 year (around 10/25/2024). You can have the follow up appointment with Dr. Dellis Anes or a Nurse Practicioner (our Nurse Practitioners are excellent and always have Physician oversight!).    Please inform us of any Emergency Department visits, hospitalizations, or changes in symptoms. Call us before going to the ED for breathing or allergy symptoms since we might be able to fit you in for a sick visit. Feel free to contact us anytime with any questions, problems, or concerns.  It was a pleasure to see you again today! Thank you for your service!    Websites that have reliable patient information: 1. American Academy of Asthma, Allergy, and Immunology: www.aaaai.org 2. Food Allergy Research and Education (FARE): foodallergy.org 3. Mothers of Asthmatics: http://www.asthmacommunitynetwork.org 4. American College of Allergy, Asthma, and Immunology: www.acaai.org      "Like" [place] on Facebook and Instagram for our latest updates!      A healthy democracy works best when [employer] participate! Make sure you are registered to vote! If you have moved or changed any of your contact information, you will need to get this updated before voting! Scan the QR codes below to learn more!

## 2023-10-29 ENCOUNTER — Encounter: Payer: Self-pay | Admitting: Allergy & Immunology

## 2024-10-31 ENCOUNTER — Ambulatory Visit: Payer: 59 | Admitting: Allergy & Immunology
